# Patient Record
Sex: Female | Born: 1965 | Race: White | Hispanic: No | Marital: Married | Smoking: Never smoker
Health system: Southern US, Community
[De-identification: ages and names within clinical notes are randomized; demographics above are authoritative.]

## PROBLEM LIST (undated history)

## (undated) DIAGNOSIS — I10 Essential (primary) hypertension: Secondary | ICD-10-CM

## (undated) DIAGNOSIS — E785 Hyperlipidemia, unspecified: Secondary | ICD-10-CM

## (undated) HISTORY — PX: TONSILLECTOMY: SUR1361

## (undated) HISTORY — PX: KNEE SURGERY: SHX244

---

## 2013-05-13 ENCOUNTER — Encounter (HOSPITAL_COMMUNITY): Payer: Self-pay | Admitting: Emergency Medicine

## 2013-05-13 ENCOUNTER — Emergency Department (HOSPITAL_COMMUNITY): Payer: BC Managed Care – PPO

## 2013-05-13 ENCOUNTER — Emergency Department (HOSPITAL_COMMUNITY)
Admission: EM | Admit: 2013-05-13 | Discharge: 2013-05-13 | Disposition: A | Discharge status: Home / Self Care | Payer: BC Managed Care – PPO | Attending: Emergency Medicine | Admitting: Emergency Medicine

## 2013-05-13 DIAGNOSIS — Z8669 Personal history of other diseases of the nervous system and sense organs: Secondary | ICD-10-CM | POA: Insufficient documentation

## 2013-05-13 DIAGNOSIS — I10 Essential (primary) hypertension: Secondary | ICD-10-CM | POA: Insufficient documentation

## 2013-05-13 DIAGNOSIS — Z79899 Other long term (current) drug therapy: Secondary | ICD-10-CM | POA: Insufficient documentation

## 2013-05-13 DIAGNOSIS — E785 Hyperlipidemia, unspecified: Secondary | ICD-10-CM | POA: Insufficient documentation

## 2013-05-13 DIAGNOSIS — R0789 Other chest pain: Secondary | ICD-10-CM | POA: Insufficient documentation

## 2013-05-13 DIAGNOSIS — N951 Menopausal and female climacteric states: Secondary | ICD-10-CM | POA: Insufficient documentation

## 2013-05-13 DIAGNOSIS — R232 Flushing: Secondary | ICD-10-CM

## 2013-05-13 HISTORY — DX: Hyperlipidemia, unspecified: E78.5

## 2013-05-13 HISTORY — DX: Essential (primary) hypertension: I10

## 2013-05-13 LAB — CBC WITH DIFFERENTIAL/PLATELET
Basophils Absolute: 0 10*3/uL (ref 0.0–0.1)
Eosinophils Relative: 1 % (ref 0–5)
HCT: 34.6 % — ABNORMAL LOW (ref 36.0–46.0)
Lymphocytes Relative: 23 % (ref 12–46)
Lymphs Abs: 2.4 10*3/uL (ref 0.7–4.0)
MCHC: 35 g/dL (ref 30.0–36.0)
MCV: 87.2 fL (ref 78.0–100.0)
Monocytes Absolute: 0.7 10*3/uL (ref 0.1–1.0)
RBC: 3.97 MIL/uL (ref 3.87–5.11)
RDW: 13.3 % (ref 11.5–15.5)
WBC: 10.1 10*3/uL (ref 4.0–10.5)

## 2013-05-13 LAB — BASIC METABOLIC PANEL
BUN: 16 mg/dL (ref 6–23)
CO2: 24 mEq/L (ref 19–32)
Calcium: 8.9 mg/dL (ref 8.4–10.5)
Chloride: 104 mEq/L (ref 96–112)
Creatinine, Ser: 0.72 mg/dL (ref 0.50–1.10)
Glucose, Bld: 102 mg/dL — ABNORMAL HIGH (ref 70–99)
Potassium: 3.7 mEq/L (ref 3.5–5.1)

## 2013-05-13 LAB — TROPONIN I
Troponin I: <0.3 ng/mL (ref <0.30)
Troponin I: <0.3 ng/mL (ref <0.30)

## 2013-05-13 NOTE — ED Notes (Signed)
Pt here via EMS with chest pain.  Pt sts she felt pressure to to central chest; no radiation.  Pt sts prior to pain she felt very a wave of heat.  Pt flushed on arrival.  BP initially was 200/120 with fire; BP 170/100 with EMS.  Pt given 324 ASA and 1 nitro PTA.  Pt currently pain free on arrival. CBG 101.

## 2013-05-13 NOTE — ED Notes (Signed)
Patient is resting comfortably. 

## 2013-05-13 NOTE — ED Notes (Signed)
MD at bedside. 

## 2013-05-13 NOTE — ED Notes (Signed)
Family at bedside. 

## 2013-05-13 NOTE — ED Provider Notes (Signed)
CSN: [id number]     Arrival date & time 05/13/13  1520 History   First MD Initiated Contact with Patient 05/13/13 1526     Chief Complaint  Patient presents with  . Chest Pain   (Consider location/radiation/quality/duration/timing/severity/associated sxs/prior Treatment) Patient is a 47 y.o. female presenting with chest pain.  Chest Pain  Pt reports occasional 'hot flashes' recently which she attributed to being peri-menopausal. She had a particularly severe episode begin earlier this morning around 10am which got progressively worse after lunch and progressed to include general ill feeling and lightheadedness. She denies any headache, blurry vision, palpitations, tremor, or nausea during that time. She and a coworker went to a nearby firestation where they found her to be very hypertensive. EMS was called, BP still up but improved some. She began to develop a midsternal chest tightness during that time, moderate, non-radiating and not associated with SOB or nausea. She was given ASA and NTG with some improvement in symptoms and is pain free now although she still feels flushed. Denies any history of CAD, but does have HTN, HLD and OSA. She is not a smoker, not on exogenous estrogen but just returned from plane trip to [place] earlier this month.  Past Medical History  Diagnosis Date  . Hypertension   . Hyperlipidemia    Past Surgical History  Procedure Laterality Date  . Knee surgery    . Tonsillectomy     History reviewed. No pertinent family history. History  Substance Use Topics  . Smoking status: Never Smoker   . Smokeless tobacco: Not on file  . Alcohol Use: Yes     Comment: socially   OB History   Grav Para Term Preterm Abortions TAB SAB Ect Mult Living                 Review of Systems  Cardiovascular: Positive for chest pain.   All other systems reviewed and are negative except as noted in HPI.   Allergies  Bee venom  Home Medications   Current Outpatient Rx   Name  Route  Sig  Dispense  Refill  . rosuvastatin (CRESTOR) 10 MG tablet   Oral   Take 10 mg by mouth daily.          BP 119/76  Pulse 83  Temp(Src) 98.8 F (37.1 C) (Oral)  Resp 12  Ht 6\' 1"  (1.854 m)  Wt 276 lb (125.193 kg)  BMI 36.42 kg/m2  SpO2 99%  LMP 04/29/2013 Physical Exam  Nursing note and vitals reviewed. Constitutional: She is oriented to person, place, and time. She appears well-developed and well-nourished.  HENT:  Head: Normocephalic and atraumatic.  Eyes: EOM are normal. Pupils are equal, round, and reactive to light.  Neck: Normal range of motion. Neck supple.  Cardiovascular: Normal rate, normal heart sounds and intact distal pulses.   Pulmonary/Chest: Effort normal and breath sounds normal.  Abdominal: Bowel sounds are normal. She exhibits no distension. There is no tenderness.  Musculoskeletal: Normal range of motion. She exhibits no edema and no tenderness.  Neurological: She is alert and oriented to person, place, and time. She has normal strength. No cranial nerve deficit or sensory deficit.  Skin: Skin is warm and dry. No rash noted.  Psychiatric: She has a normal mood and affect.    ED Course  Procedures (including critical care time) Labs Review Labs Reviewed  CBC WITH DIFFERENTIAL - Abnormal; Notable for the following:    HCT 34.6 (*)    All  other components within normal limits  BASIC METABOLIC PANEL - Abnormal; Notable for the following:    Glucose, Bld 102 (*)    All other components within normal limits  TROPONIN I  D-DIMER, QUANTITATIVE  TROPONIN I   Imaging Review Dg Chest 2 View  05/13/2013   CLINICAL DATA:  Chest pain.  EXAM: CHEST  2 VIEW  COMPARISON:  None.  FINDINGS: Platelike atelectasis left lung base. No significant infiltrate. Mediastinum and hilar structures are normal. Heart size normal. Pulmonary vascularity normal. No acute bony abnormality. No pneumothorax.  IMPRESSION: Mild platelike atelectasis left lung base,  otherwise negative chest.   Electronically Signed   By: Maisie Fus  Register   On: 05/13/2013 16:31    EKG Interpretation     Ventricular Rate:  77 PR Interval:  167 QRS Duration: 87 QT Interval:  376 QTC Calculation: 425 R Axis:   48 Text Interpretation:  Sinus rhythm Low voltage, precordial leads No old tracing to compare            MDM   1. Atypical chest pain   2. Hot flashes      Pt with atypical symptoms, TIMI 1 for risk factors but otherwise well appearing and symptom free in the ED. Discussed admission vs close outpatient follow up and she prefers to go home. Will check delta troponin, continue to observe in the ED and plan for discharge if remains neg.     Storey Stangeland B. Bernette Mayers, MD 05/13/13 2008

## 2013-05-13 NOTE — ED Notes (Signed)
Pt back from X-ray.  

## 2013-05-13 NOTE — ED Notes (Signed)
Signature pad not working, got verbal from patient

## 2013-10-18 ENCOUNTER — Encounter: Payer: Self-pay | Admitting: Podiatry

## 2013-10-18 ENCOUNTER — Ambulatory Visit (INDEPENDENT_AMBULATORY_CARE_PROVIDER_SITE_OTHER): Payer: BC Managed Care – PPO

## 2013-10-18 ENCOUNTER — Ambulatory Visit (INDEPENDENT_AMBULATORY_CARE_PROVIDER_SITE_OTHER): Payer: BC Managed Care – PPO | Admitting: Podiatry

## 2013-10-18 DIAGNOSIS — M722 Plantar fascial fibromatosis: Secondary | ICD-10-CM

## 2013-10-18 DIAGNOSIS — R52 Pain, unspecified: Secondary | ICD-10-CM

## 2013-10-18 MED ORDER — TRIAMCINOLONE ACETONIDE 10 MG/ML IJ SUSP
10.0000 mg | Freq: Once | INTRAMUSCULAR | Status: AC
  Administered 2013-10-18: 10 mg

## 2013-10-18 NOTE — Progress Notes (Signed)
Subjective:     Patient ID: Kylie Mccormick, female   DOB: October 18, 1965, 48 y.o.   MRN: [id number]  HPI patient presents stating my heel has been bothering me for about a month and gradually making it hard for me to walk. I've tried ice it and I tried to change shoe gear   Review of Systems  All other systems reviewed and are negative.      Objective:   Physical Exam  Nursing note and vitals reviewed. Constitutional: She is oriented to person, place, and time.  Cardiovascular: Intact distal pulses.   Musculoskeletal: Normal range of motion.  Neurological: She is oriented to person, place, and time.  Skin: Skin is warm.   neurovascular status intact with muscle strength adequate in range of motion of the subtalar and midtarsal joint within normal limits. Patient's found to have normal Fill time and arch height with discomfort in the plantar heel right at the insertion of the tendon into the calcaneus     Assessment:     Plantar fasciitis right with inflammation around the medial pain    Plan:     H&P and x-rays reviewed. Injected the plantar heel 3 mg Kenalog 5 mm Xylocaine Marcaine mixture dispense fascial brace and gave instructions on anti-inflammatories and physical therapy. Reappoint her recheck

## 2013-10-18 NOTE — Progress Notes (Signed)
   Subjective:    Patient ID: Kylie Mccormick, female    DOB: 1966-02-14, 48 y.o.   MRN: [id number]  HPI PT STATED RT FOOT HEEL IS BEEN HURTING FOR 3-4 WEEKS. THE HEEL IS GETTING WORSE. THE FOOT AGGRAVATED BY WALKING AND FIRST THING IN THE MORNING. TRIED TO ICE IT AND HELP SOME.   Review of Systems  Constitutional: Positive for unexpected weight change.  Musculoskeletal: Positive for gait problem and myalgias.  Neurological: Positive for numbness.  All other systems reviewed and are negative.      Objective:   Physical Exam        Assessment & Plan:

## 2013-10-18 NOTE — Patient Instructions (Signed)

## 2013-10-21 ENCOUNTER — Ambulatory Visit: Payer: BC Managed Care – PPO | Admitting: Podiatry

## 2013-10-28 ENCOUNTER — Ambulatory Visit (INDEPENDENT_AMBULATORY_CARE_PROVIDER_SITE_OTHER): Payer: BC Managed Care – PPO | Admitting: Podiatry

## 2013-10-28 ENCOUNTER — Encounter: Payer: Self-pay | Admitting: Podiatry

## 2013-10-28 VITALS — BP 112/65 | HR 72 | Resp 16

## 2013-10-28 DIAGNOSIS — M722 Plantar fascial fibromatosis: Secondary | ICD-10-CM

## 2013-10-29 NOTE — Progress Notes (Signed)
Subjective:     Patient ID: Kylie Mccormick, female   DOB: Jan 11, 1966, 48 y.o.   MRN: [id number]  HPI patient states my heel is doing quite a bit better with mild discomfort if I have been on it for to long   Review of Systems     Objective:   Physical Exam Neurovascular status intact with significant diminishment of discomfort plantar aspect right heel upon palpation    Assessment:     Plantar fasciitis of the right heel is improving but still present    Plan:     Advised on physical therapy anti-inflammatory and supportive shoe gear usage. Reappoint if symptoms indicate for orthotics and

## 2014-07-25 ENCOUNTER — Encounter: Payer: Self-pay | Admitting: Podiatry

## 2014-07-25 ENCOUNTER — Ambulatory Visit (INDEPENDENT_AMBULATORY_CARE_PROVIDER_SITE_OTHER): Payer: BLUE CROSS/BLUE SHIELD | Admitting: Podiatry

## 2014-07-25 VITALS — BP 125/87 | HR 74 | Resp 16

## 2014-07-25 DIAGNOSIS — M722 Plantar fascial fibromatosis: Secondary | ICD-10-CM

## 2014-07-25 MED ORDER — MELOXICAM 15 MG PO TABS: 15.0000 mg | ORAL_TABLET | Freq: Every day | ORAL | Status: AC

## 2014-07-25 MED ORDER — TRIAMCINOLONE ACETONIDE 10 MG/ML IJ SUSP
10.0000 mg | Freq: Once | INTRAMUSCULAR | Status: AC
  Administered 2014-07-25: 10 mg

## 2014-07-27 NOTE — Progress Notes (Signed)
Subjective:     Patient ID: Kylie Mccormick, female   DOB: 1965/07/30, 49 y.o.   MRN: [id number]  HPI patient states that her right heel is bothering her quite a bit and she admits she should've been here for 5 months ago. She's tried to stay active because of her weight loss   Review of Systems     Objective:   Physical Exam Neurovascular status is intact with extreme discomfort plantar aspect right heel at the insertion of the tendon that only got better for about one month after previous treatment    Assessment:     Neurovascular status is intact with muscle strength adequate and no other changes in history noted. Patient does have acute plantar fasciitis right with mechanical dysfunction of the right arch    Plan:     Reviewed this condition and discussed utilization of anti-inflammatories and physical therapy and shoe gear modification. Reinjected the plantar fascia 3 mg Kenalog 5 mg Xylocaine and today went ahead and dispensed night splint with all instructions on usage. Did go ahead today and also scanned for custom orthotics to reduce the stress on the plantar arch

## 2014-07-28 ENCOUNTER — Ambulatory Visit: Payer: Self-pay | Admitting: Podiatry

## 2014-08-15 ENCOUNTER — Ambulatory Visit: Payer: BLUE CROSS/BLUE SHIELD | Admitting: Podiatry

## 2014-08-18 ENCOUNTER — Ambulatory Visit (INDEPENDENT_AMBULATORY_CARE_PROVIDER_SITE_OTHER): Payer: BLUE CROSS/BLUE SHIELD | Admitting: Podiatry

## 2014-08-18 ENCOUNTER — Encounter: Payer: Self-pay | Admitting: Podiatry

## 2014-08-18 VITALS — BP 131/79 | HR 74 | Resp 11

## 2014-08-18 DIAGNOSIS — M722 Plantar fascial fibromatosis: Secondary | ICD-10-CM

## 2014-08-18 NOTE — Progress Notes (Signed)
Subjective:     Patient ID: Kylie Mccormick, female   DOB: 07-06-1965, 49 y.o.   MRN: [id number]  HPI patient states the pain is slowly coming back right but it is nowhere near as intense as it was previously   Review of Systems     Objective:   Physical Exam Her vascular status intact with mild to moderate discomfort plantar aspect right heel at the insertion with significant reduction of discomfort from previous    Assessment:     Improved plantar fasciitis right with moderate discomfort still noted upon deep palpation    Plan:     Continue with night splint physical therapy stretching exercises and ice. Dispensed orthotics with instructions and discussed that we may need to make her a second pair for different types of shoes. Reappoint 6 weeks

## 2014-09-29 ENCOUNTER — Ambulatory Visit (INDEPENDENT_AMBULATORY_CARE_PROVIDER_SITE_OTHER): Payer: BLUE CROSS/BLUE SHIELD | Admitting: Podiatry

## 2014-09-29 ENCOUNTER — Encounter: Payer: Self-pay | Admitting: Podiatry

## 2014-09-29 VITALS — BP 115/75 | HR 83 | Resp 15

## 2014-09-29 DIAGNOSIS — M722 Plantar fascial fibromatosis: Secondary | ICD-10-CM

## 2014-09-29 NOTE — Progress Notes (Signed)
Subjective:     Patient ID: Kylie Mccormick, female   DOB: 1966/05/16, 49 y.o.   MRN: [id number]  HPI patient states I'm doing well with my right foot but still having pain if I walk too much. Patient states the orthotics really help but she cannot wear them in all shoes   Review of Systems     Objective:   Physical Exam Neurovascular status intact muscle strength adequate with thick type orthotic devices which fit well but did not work and a lot of the shoe she needs to wear    Assessment:     Plantar fasciitis that is improving quite well with orthotics    Plan:     Advised on new orthotics for this patient to try to wear with different types of shoes and she is scanned at this time

## 2016-04-10 ENCOUNTER — Ambulatory Visit: Payer: BLUE CROSS/BLUE SHIELD | Admitting: Podiatry

## 2016-04-18 ENCOUNTER — Ambulatory Visit (INDEPENDENT_AMBULATORY_CARE_PROVIDER_SITE_OTHER): Payer: BLUE CROSS/BLUE SHIELD | Admitting: Podiatry

## 2016-04-18 ENCOUNTER — Ambulatory Visit (INDEPENDENT_AMBULATORY_CARE_PROVIDER_SITE_OTHER): Payer: BLUE CROSS/BLUE SHIELD

## 2016-04-18 DIAGNOSIS — M79672 Pain in left foot: Secondary | ICD-10-CM

## 2016-04-18 DIAGNOSIS — M779 Enthesopathy, unspecified: Secondary | ICD-10-CM

## 2016-04-18 NOTE — Progress Notes (Signed)
Subjective:     Patient ID: Kylie Mccormick, female   DOB: [birth year], 50 y.o.   MRN: [id number]  HPI patient presents concerned because the inside of her left ankle has become sore over the last couple weeks after walking [place] the beach   Review of Systems     Objective:   Physical Exam Neurovascular status intact appears to be no damage to the posterior tibial tendon but it is painful at its insertion into the navicular with no depression of the arch    Assessment:     Inflammatory tenderness condition with possibility for bone issue    Plan:     H&P condition reviewed and recommended fascial brace to lift up the arch along with ice therapy and possibility for future steroidal injection  X-ray report indicates no indications of fracture of the right or left foot with no depression of the arch

## 2017-05-01 ENCOUNTER — Encounter: Payer: Self-pay | Admitting: Podiatry

## 2017-05-01 ENCOUNTER — Ambulatory Visit (INDEPENDENT_AMBULATORY_CARE_PROVIDER_SITE_OTHER): Payer: BLUE CROSS/BLUE SHIELD | Admitting: Podiatry

## 2017-05-01 ENCOUNTER — Ambulatory Visit (INDEPENDENT_AMBULATORY_CARE_PROVIDER_SITE_OTHER): Payer: BLUE CROSS/BLUE SHIELD

## 2017-05-01 ENCOUNTER — Other Ambulatory Visit: Payer: Self-pay | Admitting: Podiatry

## 2017-05-01 VITALS — BP 117/78 | HR 72 | Resp 16

## 2017-05-01 DIAGNOSIS — L03032 Cellulitis of left toe: Secondary | ICD-10-CM | POA: Diagnosis not present

## 2017-05-01 DIAGNOSIS — M779 Enthesopathy, unspecified: Secondary | ICD-10-CM | POA: Diagnosis not present

## 2017-05-01 DIAGNOSIS — M79672 Pain in left foot: Secondary | ICD-10-CM | POA: Diagnosis not present

## 2017-05-01 NOTE — Progress Notes (Signed)
Subjective:    Patient ID: Kylie LeitzAngela Mccormick, female   DOB: 51 y.o.   MRN: [id number]   HPI patient states that she's had a lot of problems with her left hallux nail for the last several weeks and that it's gradually gotten worse and there is been some redness and last night it was severely throbbing    ROS      Objective:  Physical Exam neurovascular status intact with patient found to have a incurvated left hallux both medial lateral border with localized drainage with no proximal edema erythema drainage currently noted     Assessment:    Paronychia infection left hallux localized in nature     Plan:    H&P condition reviewed and at this point I infiltrated the left hallux 60 g like Marcaine mixture and was sterile instrumentation after sterile prep of the toe I removed the medial lateral borders cleaned up the bed and remove necrotic tissue and allowed channel for drainage. Gave strict instructions of any proximal edema erythema drainage were to occur to let us know immediately and this should heal uneventfully with soaks

## 2017-05-01 NOTE — Patient Instructions (Signed)

## 2017-05-02 ENCOUNTER — Telehealth: Payer: Self-pay | Admitting: Podiatry

## 2017-05-02 NOTE — Telephone Encounter (Signed)
I saw Dr. Charlsie Merlesegal yesterday and he cut out some of my toenail. I'm in a lot of pain today. Please call me back at [phone]. Thank you very much. Bye bye.

## 2017-05-02 NOTE — Telephone Encounter (Signed)
I spoke with pt and she said the minute she took off the dressings and began the soaks she felt better. I told pt to perform the soaks for 20 minutes twice daily for 4-6 weeks until the areas got a dry hard scab with out redness, swelling or drainage, and if tolerated take OTC ibuprofen as package directed for the inflammation and the discomfort. Pt states understanding.

## 2020-06-13 ENCOUNTER — Ambulatory Visit (HOSPITAL_COMMUNITY)
Admission: EM | Admit: 2020-06-13 | Discharge: 2020-06-13 | Disposition: A | Discharge status: Home / Self Care | Payer: BC Managed Care – PPO | Attending: Student | Admitting: Student

## 2020-06-13 ENCOUNTER — Encounter (HOSPITAL_COMMUNITY): Payer: Self-pay

## 2020-06-13 ENCOUNTER — Ambulatory Visit (INDEPENDENT_AMBULATORY_CARE_PROVIDER_SITE_OTHER): Payer: BC Managed Care – PPO

## 2020-06-13 ENCOUNTER — Other Ambulatory Visit: Payer: Self-pay

## 2020-06-13 DIAGNOSIS — J069 Acute upper respiratory infection, unspecified: Secondary | ICD-10-CM | POA: Diagnosis not present

## 2020-06-13 DIAGNOSIS — R509 Fever, unspecified: Secondary | ICD-10-CM

## 2020-06-13 DIAGNOSIS — R059 Cough, unspecified: Secondary | ICD-10-CM

## 2020-06-13 NOTE — ED Triage Notes (Signed)
Pt in with c/o productive cough and congestion, and fever Tmax 101.9 that has been going on since Sunday  Pt has been taking amoxicillin and promethazine cough syrup with minimal relief  Pt has gotten covid test pending results Had strep and flu was negative

## 2020-06-13 NOTE — ED Provider Notes (Signed)
[facility]    CSN: [id number] Arrival date & time: 06/13/20  1921      History   Chief Complaint Chief Complaint  Patient presents with  . Cough  . Nasal Congestion  . Fever  . Back Pain    HPI Kylie Mccormick is a 54 y.o. female presenting with 5 days of - productive cough, nasal congestion, fevers (highest today was 101.9)- has been taking tylenol, and back pain. Positive covid exposure at work. Has already been seen for these symptoms multiple times and is being treated for a sinus infection- has been taking amoxicillin and promethazine cough syrup, mucinex and nyquil. Endorses body aches. Denies n/v/d, shortness of breath, chest pain, teeth pain, headaches, sore throat, loss of taste/smell, ear pain.    Per pt 1st covid test was negative, but Covid test is pending but strep and flu have been negative. Negative strep test and negative influenza test already.   HPI  Past Medical History:  Diagnosis Date  . Hyperlipidemia   . Hypertension     There are no problems to display for this patient.   Past Surgical History:  Procedure Laterality Date  . KNEE SURGERY    . TONSILLECTOMY      OB History   No obstetric history on file.      Home Medications    Prior to Admission medications   Medication Sig Start Date End Date Taking? Authorizing Provider  Biotin 10 MG TABS Take by mouth.    [provider]  buPROPion (WELLBUTRIN XL) 300 MG 24 hr tablet Take 300 mg by mouth daily.    [provider]  Calcium Carbonate-Vitamin D 600-400 MG-UNIT chew tablet Chew by mouth.    [provider]  Cholecalciferol (D3 DOTS) 2000 UNITS TBDP Take by mouth.    [provider]  cyanocobalamin 500 MCG tablet Take 500 mcg by mouth daily.    [provider]  EPINEPHrine 0.3 mg/0.3 mL IJ SOAJ injection Use as directed if use call 911 09/19/15   [provider]  fexofenadine (ALLEGRA) 180 MG tablet Take 180 mg by mouth  daily as needed for allergies or rhinitis.    [provider]  FIBER ADULT GUMMIES PO Take by mouth.    [provider]  meloxicam (MOBIC) 15 MG tablet Take 1 tablet (15 mg total) by mouth daily. 07/25/14   Lenn Sink, DPM  Multiple Vitamins-Minerals (ADULT GUMMY) CHEW Chew 1 each by mouth daily.    [provider]  omeprazole (PRILOSEC) 20 MG capsule Take 20 mg by mouth daily.    [provider]  rosuvastatin (CRESTOR) 10 MG tablet Take 10 mg by mouth daily.    [provider]  ursodiol (ACTIGALL) 300 MG capsule Take 300 mg by mouth 2 (two) times daily.    [provider]  Vilazodone HCl (VIIBRYD) 40 MG TABS Take 40 mg by mouth daily.    [provider]  vitamin C (ASCORBIC ACID) 500 MG tablet Take 500 mg by mouth daily.    [provider]    Family History History reviewed. No pertinent family history.  Social History Social History   Tobacco Use  . Smoking status: Never Smoker  . Smokeless tobacco: Never Used  Substance Use Topics  . Alcohol use: Yes    Comment: socially     Allergies   Bee venom   Review of Systems Review of Systems  Constitutional: Positive for chills, diaphoresis and fever.  Negative for appetite change.  HENT: Positive for congestion, sinus pain and sore throat.   Respiratory: Positive for cough. Negative for chest tightness, shortness of breath and wheezing.   Cardiovascular: Negative for chest pain.  Gastrointestinal: Negative for abdominal pain, diarrhea, nausea and vomiting.  Musculoskeletal: Positive for myalgias.  All other systems reviewed and are negative.    Physical Exam Triage Vital Signs ED Triage Vitals  Enc Vitals Group     BP 06/13/20 2015 130/88     Pulse Rate 06/13/20 2012 92     Resp 06/13/20 2012 20     Temp 06/13/20 2012 98.4 F (36.9 C)     Temp Source 06/13/20 2012 Oral     SpO2 06/13/20 2012 96 %     Weight --      Height --      Head  Circumference --      Peak Flow --      Pain Score 06/13/20 2010 7     Pain Loc --      Pain Edu? --      Excl. in GC? --    No data found.  Updated Vital Signs BP 130/88   Pulse 92   Temp 98.4 F (36.9 C) (Oral)   Resp 20   LMP 04/29/2013   SpO2 96%   Visual Acuity Right Eye Distance:   Left Eye Distance:   Bilateral Distance:    Right Eye Near:   Left Eye Near:    Bilateral Near:     Physical Exam Vitals reviewed.  Constitutional:      General: She is not in acute distress.    Appearance: Normal appearance. She is well-developed. She is not ill-appearing.  HENT:     Head: Normocephalic and atraumatic.     Salivary Glands: Right salivary gland is not diffusely enlarged or tender. Left salivary gland is not diffusely enlarged or tender.     Right Ear: Hearing, tympanic membrane, ear canal and external ear normal. No drainage. No middle ear effusion. Tympanic membrane is not perforated, erythematous, retracted or bulging.     Left Ear: Hearing, tympanic membrane, ear canal and external ear normal. No drainage.  No middle ear effusion. Tympanic membrane is not perforated, erythematous, retracted or bulging.     Nose: Congestion present.     Right Sinus: Maxillary sinus tenderness and frontal sinus tenderness present.     Left Sinus: Maxillary sinus tenderness and frontal sinus tenderness present.     Mouth/Throat:     Mouth: Mucous membranes are moist.     Pharynx: Uvula midline. No oropharyngeal exudate, posterior oropharyngeal erythema or uvula swelling.     Tonsils: No tonsillar exudate.  Cardiovascular:     Rate and Rhythm: Normal rate and regular rhythm.     Heart sounds: Normal heart sounds.  Pulmonary:     Effort: Pulmonary effort is normal.     Breath sounds: Normal breath sounds and air entry.  Abdominal:     General: Bowel sounds are normal.     Palpations: Abdomen is soft.     Tenderness: There is no abdominal tenderness. There is no guarding or rebound.  Negative signs include Murphy's sign and McBurney's sign.  Lymphadenopathy:     Cervical: Cervical adenopathy present.  Neurological:     General: No focal deficit present.     Mental Status: She is alert.  Psychiatric:        Attention and Perception: Attention and perception normal.  Mood and Affect: Mood and affect normal.        Behavior: Behavior is cooperative.      UC Treatments / Results  Labs (all labs ordered are listed, but only abnormal results are displayed) Labs Reviewed - No data to display  EKG   Radiology No results found.  Procedures Procedures (including critical care time)  Medications Ordered in UC Medications - No data to display  Initial Impression / Assessment and Plan / UC Course  I have reviewed the triage vital signs and the nursing notes.  Pertinent labs & imaging results that were available during my care of the patient were reviewed by me and considered in my medical decision making (see chart for details).     Pt reports negative rapid strep and negative influenza tests already 2 days ago; deferred today. She has already had one negative covid test and is awaiting another covid test, so deferred this today too.   CXR today showing No active cardiopulmonary disease.. Reassurance provided.  She is already being treated for a sinus infection with amoxicllin and promethazine. Continue these. Symptomatic treatment with Mucinex, Nyquil, etc.   Return precautions- chest pain, shortness of breath, new/worsening fevers/chills, confusion, worsening of symptoms despite the above treatment plan, etc.    Final Clinical Impressions(s) / UC Diagnoses   Final diagnoses:  None   Discharge Instructions   None    ED Prescriptions    None     PDMP not reviewed this encounter.   Rhys Martini, PA-C 06/13/20 2052

## 2020-06-13 NOTE — Discharge Instructions (Signed)
Continue taking your amoxicillin, Afrin, Nyquil/Mucinex. Hydrate well at home. You can also try Flonase nasal steroid (over the counter). Seek medical attention if you start running high fevers, develop chest pain, develop shortness of breath.

## 2021-11-20 ENCOUNTER — Ambulatory Visit: Payer: BC Managed Care – PPO | Admitting: Podiatry

## 2022-06-29 IMAGING — DX DG CHEST 2V
2 series · 2 of 2 positions shown · non-contrast
Comparison: 05/13/2013

CLINICAL DATA: Fever, cough

EXAM:
CHEST - 2 VIEW

[chest pa]
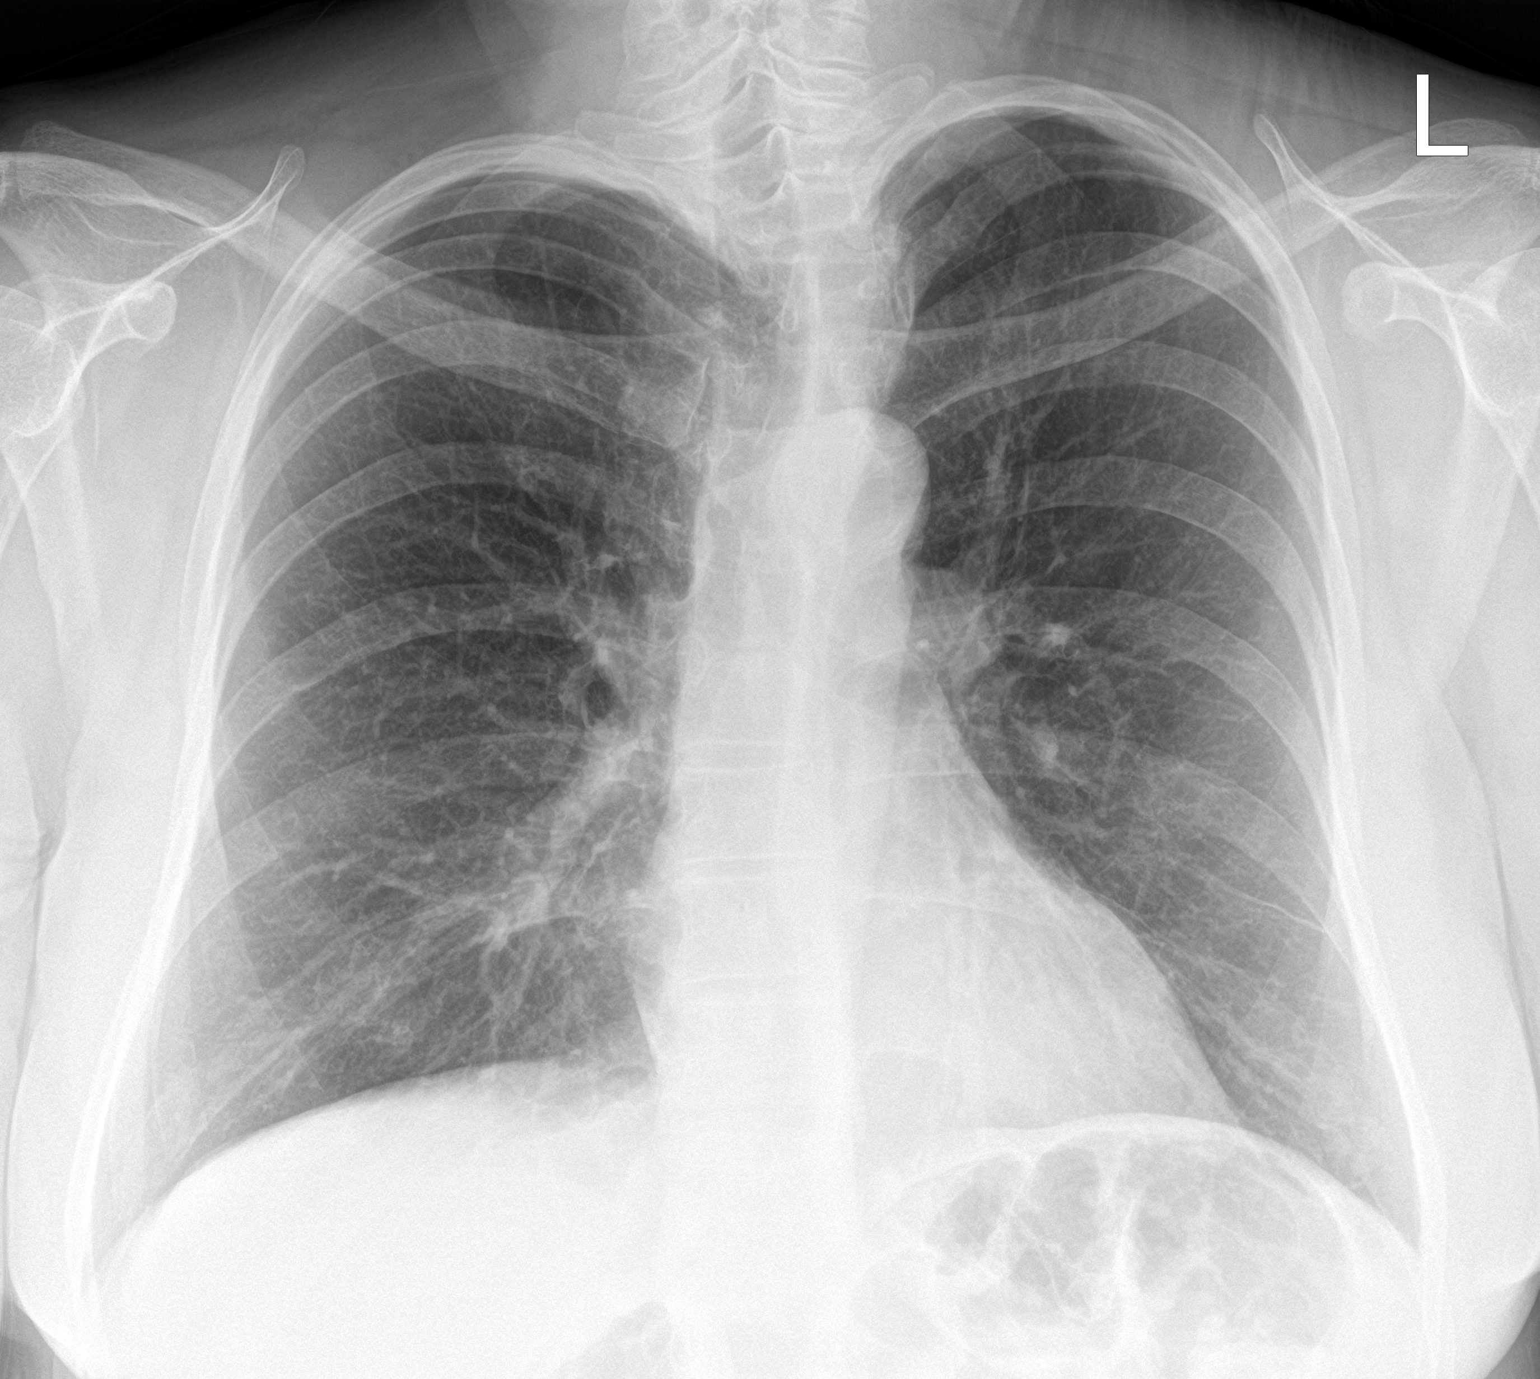

[chest lat]
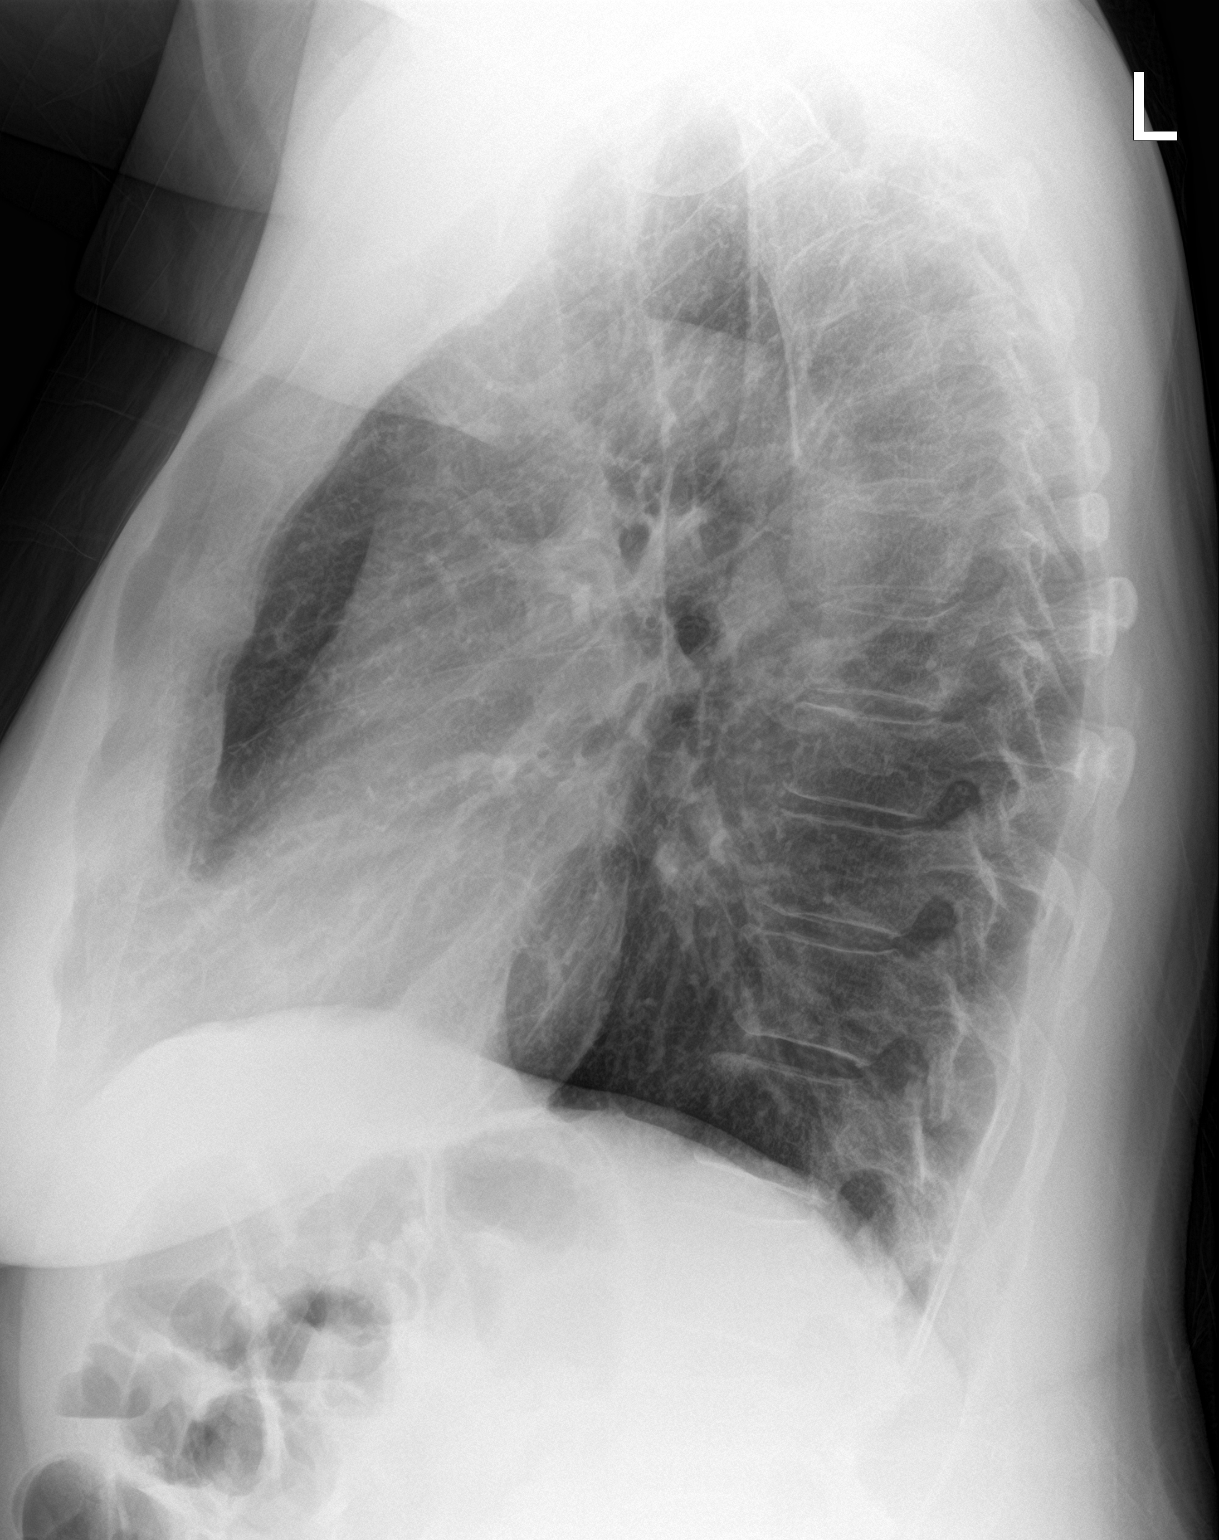

[2 of 2 positions shown; findings below may reference images not displayed]

FINDINGS: The heart size and mediastinal contours are within normal limits.
Both lungs are clear. The visualized skeletal structures are
unremarkable.
IMPRESSION: No active cardiopulmonary disease.
